# Patient Record
Sex: Female | Born: 1985 | Race: White | Hispanic: No | Marital: Married
Health system: Southern US, Community
[De-identification: ages and names within clinical notes are randomized; demographics above are authoritative.]

## PROBLEM LIST (undated history)

## (undated) DIAGNOSIS — F419 Anxiety disorder, unspecified: Secondary | ICD-10-CM

## (undated) DIAGNOSIS — F1990 Other psychoactive substance use, unspecified, uncomplicated: Secondary | ICD-10-CM

---

## 2005-12-02 ENCOUNTER — Ambulatory Visit (HOSPITAL_COMMUNITY): Admission: AD | Admit: 2005-12-02 | Discharge: 2005-12-02 | Payer: Self-pay | Admitting: Obstetrics and Gynecology

## 2020-02-21 ENCOUNTER — Other Ambulatory Visit: Payer: Self-pay

## 2020-02-21 ENCOUNTER — Emergency Department (HOSPITAL_COMMUNITY)
Admission: EM | Admit: 2020-02-21 | Discharge: 2020-02-21 | Discharge status: Against Medical Advice | Payer: Self-pay | Attending: Emergency Medicine | Admitting: Emergency Medicine

## 2020-02-21 ENCOUNTER — Encounter (HOSPITAL_COMMUNITY): Payer: Self-pay | Admitting: Obstetrics and Gynecology

## 2020-02-21 ENCOUNTER — Emergency Department (HOSPITAL_COMMUNITY): Payer: Self-pay

## 2020-02-21 DIAGNOSIS — S0990XA Unspecified injury of head, initial encounter: Secondary | ICD-10-CM

## 2020-02-21 DIAGNOSIS — S40212A Abrasion of left shoulder, initial encounter: Secondary | ICD-10-CM | POA: Insufficient documentation

## 2020-02-21 DIAGNOSIS — S60022A Contusion of left index finger without damage to nail, initial encounter: Secondary | ICD-10-CM | POA: Insufficient documentation

## 2020-02-21 DIAGNOSIS — M542 Cervicalgia: Secondary | ICD-10-CM | POA: Insufficient documentation

## 2020-02-21 DIAGNOSIS — M25522 Pain in left elbow: Secondary | ICD-10-CM | POA: Insufficient documentation

## 2020-02-21 HISTORY — DX: Other psychoactive substance use, unspecified, uncomplicated: F19.90

## 2020-02-21 HISTORY — DX: Anxiety disorder, unspecified: F41.9

## 2020-02-21 MED ORDER — FENTANYL CITRATE (PF) 100 MCG/2ML IJ SOLN: 50.0000 ug | Freq: Once | INTRAMUSCULAR | Status: DC

## 2020-02-21 MED ORDER — KETOROLAC TROMETHAMINE 30 MG/ML IJ SOLN
30.0000 mg | Freq: Once | INTRAMUSCULAR | Status: AC
  Administered 2020-02-21: 30 mg via INTRAMUSCULAR
  Filled 2020-02-21: qty 1

## 2020-02-21 NOTE — ED Notes (Signed)
Patient left out the room cursing she stated she was leaving and walk out the door.

## 2020-02-21 NOTE — ED Notes (Signed)
Patient not found in room. PA made aware.
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
## 2020-02-21 NOTE — ED Notes (Signed)
Patient removed C-collar. Throwing suction canister in room. Continue to request pain medication.
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
## 2020-02-21 NOTE — ED Notes (Signed)
Pt. Documented in error see above note in chart. 
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
